# Patient Record
Sex: Female | Born: 2005 | Race: White | Hispanic: No | Marital: Single | Smoking: Never smoker
Health system: Southern US, Community
[De-identification: ages and names within clinical notes are randomized; demographics above are authoritative.]

## PROBLEM LIST (undated history)

## (undated) DIAGNOSIS — J45909 Unspecified asthma, uncomplicated: Secondary | ICD-10-CM

---
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
## 2023-12-06 ENCOUNTER — Ambulatory Visit: Admitting: Adult Health
